# Patient Record
Sex: Male | Born: 1985 | Race: White | Hispanic: No | Marital: Married | Smoking: Current some day smoker
Health system: Southern US, Community
[De-identification: ages and names within clinical notes are randomized; demographics above are authoritative.]

## PROBLEM LIST (undated history)

## (undated) DIAGNOSIS — F419 Anxiety disorder, unspecified: Secondary | ICD-10-CM

## (undated) DIAGNOSIS — J45909 Unspecified asthma, uncomplicated: Secondary | ICD-10-CM

## (undated) HISTORY — PX: THUMB ARTHROSCOPY: SHX2509

---

## 2012-05-17 ENCOUNTER — Emergency Department (HOSPITAL_COMMUNITY)
Admission: EM | Admit: 2012-05-17 | Discharge: 2012-05-17 | Disposition: A | Discharge status: Home / Self Care | Payer: Self-pay | Attending: Emergency Medicine | Admitting: Emergency Medicine

## 2012-05-17 ENCOUNTER — Encounter (HOSPITAL_COMMUNITY): Payer: Self-pay | Admitting: Emergency Medicine

## 2012-05-17 DIAGNOSIS — L0100 Impetigo, unspecified: Secondary | ICD-10-CM | POA: Insufficient documentation

## 2012-05-17 HISTORY — DX: Anxiety disorder, unspecified: F41.9

## 2012-05-17 MED ORDER — CLINDAMYCIN HCL 300 MG PO CAPS: 300.0000 mg | ORAL_CAPSULE | Freq: Four times a day (QID) | ORAL | Status: AC
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
## 2012-05-17 NOTE — Discharge Instructions (Signed)
You were seen and evaluated for your scalp condition. At this time your providers feel your symptoms and rash may be caused from a bacterial infection. You have been given antibiotics to take to help with your symptoms. Please take these for the full length of time. If you develop any worsening symptoms, increasing swelling, pain, hair loss please return to the emergency room.     Impetigo Impetigo is an infection of the skin, most common in babies and children.  CAUSES  It is caused by staphylococcal or streptococcal germs (bacteria). Impetigo can start after any damage to the skin. The damage to the skin may be from things like:   Chickenpox.   Scrapes.   Scratches.   Insect bites (common when children scratch the bite).   Cuts.   Nail biting or chewing.  Impetigo is contagious. It can be spread from one person to another. Avoid close skin contact, or sharing towels or clothing. SYMPTOMS  Impetigo usually starts out as small blisters or pustules. Then they turn into tiny yellow-crusted sores (lesions).  There may also be:  Large blisters.   Itching or pain.   Pus.   Swollen lymph glands.  With scratching, irritation, or non-treatment, these small areas may get larger. Scratching can cause the germs to get under the fingernails; then scratching another part of the skin can cause the infection to be spread there. DIAGNOSIS  Diagnosis of impetigo is usually made by a physical exam. A skin culture (test to grow bacteria) may be done to prove the diagnosis or to help decide the best treatment.  TREATMENT  Mild impetigo can be treated with prescription antibiotic cream. Oral antibiotic medicine may be used in more severe cases. Medicines for itching may be used. HOME CARE INSTRUCTIONS   To avoid spreading impetigo to other body areas:   Keep fingernails short and clean.   Avoid scratching.   Cover infected areas if necessary to keep from scratching.   Gently wash the  infected areas with antibiotic soap and water.   Soak crusted areas in warm soapy water using antibiotic soap.   Gently rub the areas to remove crusts. Do not scrub.   Wash hands often to avoid spread this infection.   Keep children with impetigo home from school or daycare until they have used an antibiotic cream for 48 hours (2 days) or oral antibiotic medicine for 24 hours (1 day), and their skin shows significant improvement.   Children may attend school or daycare if they only have a few sores and if the sores can be covered by a bandage or clothing.  SEEK MEDICAL CARE IF:   More blisters or sores show up despite treatment.   Other family members get sores.   Rash is not improving after 48 hours (2 days) of treatment.  SEEK IMMEDIATE MEDICAL CARE IF:   You see spreading redness or swelling of the skin around the sores.   You see red streaks coming from the sores.   Your child develops a fever of 100.4 F (37.2 C) or higher.   Your child develops a sore throat.   Your child is acting ill (lethargic, sick to their stomach).  Document Released: 12/06/2000 Document Revised: 11/28/2011 Document Reviewed: 10/05/2008 [facility] Patient Information 2012 [place], [place].    RESOURCE GUIDE  Dental Problems  Patients with Medicaid: [facility]  [address]                                           [phone] [address] Phone:  [phone]                                                  Phone:  [phone]  If unable to pay or uninsured, contact:  [facility] or [facility]. to become qualified for the adult dental clinic.  Chronic Pain Problems Contact [facility]  [phone] Patients need to be referred by their primary care doctor.  Insufficient Money for Medicine Contact [employer]:  call "211" or [facility] (704) 235-7386.  No Primary Care Doctor Call [facility]   [phone] Other agencies that provide inexpensive medical care    [facility]  [phone]    [facility]  [phone]    [facility]  [phone]    [facility]  [phone]    [facility]  [phone]    [facility]  [phone]  [facility]  [phone] [facility] Services  [phone] [facility]   [phone] (emergency services [phone])  Substance Abuse Resources [facility]  [phone] [facility] [phone] [facility] [phone] [facility] [phone] Residential & Outpatient Substance Abuse Program  [phone]  Abuse/Neglect [facility] Child Abuse Hotline 986-229-1265 [facility] Child Abuse Hotline [phone] (After Hours)  Emergency Shelter [facility] [phone]  M[facility] [place] [facility] [phone] [facility] [phone]  MRSA Hotline #:   (773)849-1994    [facility] Resources  [facility] [place]     [employer]                          [facility] Dept. [address] [place]                       [address]      [address]  Blondell Reveal Phone:  [phone]                                   Phone:  [phone]                 Phone:  [phone]  [facility] Phone:  [phone]  [facility] Child Abuse Hotline (704) 103-1806 [phone] (After Hours)

## 2012-05-17 NOTE — ED Notes (Signed)
Patient is AOx4 and comfortable with her discharge instructions. 

## 2012-05-18 NOTE — ED Provider Notes (Signed)
Patient evaluated by myself, has a small area to the back right side of his head which is erythematous mildly tender and raised. On physical exam there is no sign of hair loss, no broken hair shafts, mild tenderness across about this 1.5 cm lesion.  I suspect this is early impetigo, antibiotics indicated patient informed of results and will followup as suggested.  Medical screening examination/treatment/procedure(s) were conducted as a shared visit with non-physician practitioner(s) and myself.  I personally evaluated the patient during the encounter     Vida Roller, MD 05/18/12 [phone]

## 2017-03-28 ENCOUNTER — Emergency Department (HOSPITAL_COMMUNITY)
Admission: EM | Admit: 2017-03-28 | Discharge: 2017-03-28 | Disposition: A | Discharge status: Home / Self Care | Payer: Medicaid Other | Attending: Emergency Medicine | Admitting: Emergency Medicine

## 2017-03-28 ENCOUNTER — Emergency Department (HOSPITAL_COMMUNITY): Payer: Medicaid Other

## 2017-03-28 ENCOUNTER — Encounter (HOSPITAL_COMMUNITY): Payer: Self-pay

## 2017-03-28 DIAGNOSIS — Z23 Encounter for immunization: Secondary | ICD-10-CM | POA: Diagnosis not present

## 2017-03-28 DIAGNOSIS — Y929 Unspecified place or not applicable: Secondary | ICD-10-CM | POA: Diagnosis not present

## 2017-03-28 DIAGNOSIS — S060X1A Concussion with loss of consciousness of 30 minutes or less, initial encounter: Secondary | ICD-10-CM | POA: Insufficient documentation

## 2017-03-28 DIAGNOSIS — M542 Cervicalgia: Secondary | ICD-10-CM | POA: Diagnosis not present

## 2017-03-28 DIAGNOSIS — S0990XA Unspecified injury of head, initial encounter: Secondary | ICD-10-CM | POA: Diagnosis present

## 2017-03-28 DIAGNOSIS — J45909 Unspecified asthma, uncomplicated: Secondary | ICD-10-CM | POA: Insufficient documentation

## 2017-03-28 DIAGNOSIS — F17200 Nicotine dependence, unspecified, uncomplicated: Secondary | ICD-10-CM | POA: Diagnosis not present

## 2017-03-28 DIAGNOSIS — Y939 Activity, unspecified: Secondary | ICD-10-CM | POA: Insufficient documentation

## 2017-03-28 DIAGNOSIS — Y999 Unspecified external cause status: Secondary | ICD-10-CM | POA: Diagnosis not present

## 2017-03-28 DIAGNOSIS — S0191XA Laceration without foreign body of unspecified part of head, initial encounter: Secondary | ICD-10-CM

## 2017-03-28 DIAGNOSIS — S0101XA Laceration without foreign body of scalp, initial encounter: Secondary | ICD-10-CM | POA: Insufficient documentation

## 2017-03-28 HISTORY — DX: Unspecified asthma, uncomplicated: J45.909

## 2017-03-28 LAB — CBC WITH DIFFERENTIAL/PLATELET
BASOS ABS: 0 10*3/uL (ref 0.0–0.1)
Basophils Relative: 0 %
EOS ABS: 0.1 10*3/uL (ref 0.0–0.7)
EOS PCT: 1 %
HCT: 43.2 % (ref 39.0–52.0)
HEMOGLOBIN: 14.8 g/dL (ref 13.0–17.0)
LYMPHS PCT: 12 %
Lymphs Abs: 1.5 10*3/uL (ref 0.7–4.0)
MCH: 31.1 pg (ref 26.0–34.0)
MCHC: 34.3 g/dL (ref 30.0–36.0)
MCV: 90.8 fL (ref 78.0–100.0)
Monocytes Absolute: 1.2 10*3/uL — ABNORMAL HIGH (ref 0.1–1.0)
Monocytes Relative: 10 %
Neutro Abs: 9.7 10*3/uL — ABNORMAL HIGH (ref 1.7–7.7)
Neutrophils Relative %: 77 %
PLATELETS: 187 10*3/uL (ref 150–400)
RBC: 4.76 MIL/uL (ref 4.22–5.81)
RDW: 12.3 % (ref 11.5–15.5)
WBC: 12.5 10*3/uL — AB (ref 4.0–10.5)

## 2017-03-28 LAB — BASIC METABOLIC PANEL
Anion gap: 11 (ref 5–15)
BUN: 7 mg/dL (ref 6–20)
CO2: 24 mmol/L (ref 22–32)
Calcium: 9.2 mg/dL (ref 8.9–10.3)
Chloride: 103 mmol/L (ref 101–111)
Creatinine, Ser: 0.98 mg/dL (ref 0.61–1.24)
GFR calc Af Amer: >60 mL/min (ref >60)
Glucose, Bld: 100 mg/dL — ABNORMAL HIGH (ref 65–99)
POTASSIUM: 3.3 mmol/L — AB (ref 3.5–5.1)
SODIUM: 138 mmol/L (ref 135–145)

## 2017-03-28 LAB — ABO/RH: ABO/RH(D): A POS

## 2017-03-28 LAB — TYPE AND SCREEN
ABO/RH(D): A POS
Antibody Screen: NEGATIVE

## 2017-03-28 MED ORDER — HYDROCODONE-ACETAMINOPHEN 5-325 MG PO TABS: 1.0000 | ORAL_TABLET | ORAL | 0 refills | Status: AC | PRN

## 2017-03-28 MED ORDER — HYDROCODONE-ACETAMINOPHEN 5-325 MG PO TABS
1.0000 | ORAL_TABLET | Freq: Once | ORAL | Status: AC
  Administered 2017-03-28: 1 via ORAL
  Filled 2017-03-28: qty 1

## 2017-03-28 MED ORDER — TETANUS-DIPHTH-ACELL PERTUSSIS 5-2.5-18.5 LF-MCG/0.5 IM SUSP
INTRAMUSCULAR | Status: AC
  Filled 2017-03-28: qty 0.5

## 2017-03-28 MED ORDER — TETANUS-DIPHTHERIA TOXOIDS TD 5-2 LFU IM INJ
0.5000 mL | INJECTION | Freq: Once | INTRAMUSCULAR | Status: DC
  Filled 2017-03-28: qty 0.5

## 2017-03-28 MED ORDER — TETANUS-DIPHTH-ACELL PERTUSSIS 5-2.5-18.5 LF-MCG/0.5 IM SUSP
0.5000 mL | Freq: Once | INTRAMUSCULAR | Status: AC
Start: 2017-03-28 — End: 2017-03-28
  Administered 2017-03-28: 0.5 mL via INTRAMUSCULAR
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
## 2017-03-28 NOTE — ED Notes (Signed)
Pt brought back from CT at this time without incident.

## 2017-03-28 NOTE — ED Notes (Signed)
3 staples placed to middle occipital region of head by resident Dr. Lonia Mad
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
## 2017-03-28 NOTE — ED Notes (Signed)
Pt taken to CT at this time.

## 2017-03-28 NOTE — ED Notes (Signed)
EKG given to Dr. Cardama  
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
## 2017-03-31 ENCOUNTER — Emergency Department (HOSPITAL_COMMUNITY)
Admission: EM | Admit: 2017-03-31 | Discharge: 2017-03-31 | Disposition: A | Discharge status: Home / Self Care | Payer: Medicaid Other | Attending: Emergency Medicine | Admitting: Emergency Medicine

## 2017-03-31 ENCOUNTER — Emergency Department (HOSPITAL_COMMUNITY): Payer: Medicaid Other

## 2017-03-31 ENCOUNTER — Encounter (HOSPITAL_COMMUNITY): Payer: Self-pay | Admitting: Emergency Medicine

## 2017-03-31 ENCOUNTER — Encounter (HOSPITAL_COMMUNITY): Payer: Self-pay | Admitting: *Deleted

## 2017-03-31 DIAGNOSIS — R42 Dizziness and giddiness: Secondary | ICD-10-CM | POA: Insufficient documentation

## 2017-03-31 DIAGNOSIS — Z79899 Other long term (current) drug therapy: Secondary | ICD-10-CM | POA: Diagnosis not present

## 2017-03-31 DIAGNOSIS — F0781 Postconcussional syndrome: Secondary | ICD-10-CM | POA: Diagnosis not present

## 2017-03-31 DIAGNOSIS — J45909 Unspecified asthma, uncomplicated: Secondary | ICD-10-CM | POA: Diagnosis not present

## 2017-03-31 DIAGNOSIS — F17200 Nicotine dependence, unspecified, uncomplicated: Secondary | ICD-10-CM | POA: Insufficient documentation

## 2017-03-31 LAB — CBC
HCT: 47.4 % (ref 39.0–52.0)
Hemoglobin: 16.6 g/dL (ref 13.0–17.0)
MCH: 31.6 pg (ref 26.0–34.0)
MCHC: 35 g/dL (ref 30.0–36.0)
MCV: 90.1 fL (ref 78.0–100.0)
PLATELETS: 211 10*3/uL (ref 150–400)
RBC: 5.26 MIL/uL (ref 4.22–5.81)
RDW: 12.2 % (ref 11.5–15.5)
WBC: 7.4 10*3/uL (ref 4.0–10.5)

## 2017-03-31 LAB — BASIC METABOLIC PANEL
Anion gap: 12 (ref 5–15)
BUN: 6 mg/dL (ref 6–20)
CALCIUM: 9.7 mg/dL (ref 8.9–10.3)
CO2: 26 mmol/L (ref 22–32)
CREATININE: 0.85 mg/dL (ref 0.61–1.24)
Chloride: 101 mmol/L (ref 101–111)
GFR calc Af Amer: >60 mL/min (ref >60)
GFR calc non Af Amer: >60 mL/min (ref >60)
Glucose, Bld: 100 mg/dL — ABNORMAL HIGH (ref 65–99)
POTASSIUM: 3.9 mmol/L (ref 3.5–5.1)
SODIUM: 139 mmol/L (ref 135–145)

## 2017-03-31 MED ORDER — DIAZEPAM 2 MG PO TABS
2.0000 mg | ORAL_TABLET | Freq: Once | ORAL | Status: AC
  Administered 2017-03-31: 2 mg via ORAL
  Filled 2017-03-31: qty 1

## 2017-03-31 MED ORDER — ONDANSETRON HCL 4 MG/2ML IJ SOLN
4.0000 mg | Freq: Once | INTRAMUSCULAR | Status: AC
  Administered 2017-03-31: 4 mg via INTRAVENOUS
  Filled 2017-03-31: qty 2

## 2017-03-31 MED ORDER — MECLIZINE HCL 25 MG PO TABS: 25.0000 mg | ORAL_TABLET | Freq: Three times a day (TID) | ORAL | 0 refills | Status: AC | PRN

## 2017-03-31 MED ORDER — MECLIZINE HCL 25 MG PO TABS
25.0000 mg | ORAL_TABLET | Freq: Once | ORAL | Status: AC
  Administered 2017-03-31: 25 mg via ORAL
  Filled 2017-03-31: qty 1

## 2017-03-31 MED ORDER — DIAZEPAM 5 MG PO TABS: 2.5000 mg | ORAL_TABLET | Freq: Two times a day (BID) | ORAL | 0 refills | Status: AC | PRN

## 2017-03-31 MED ORDER — SODIUM CHLORIDE 0.9 % IV BOLUS (SEPSIS)
1000.0000 mL | Freq: Once | INTRAVENOUS | Status: AC
  Administered 2017-03-31: 1000 mL via INTRAVENOUS
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
## 2017-03-31 NOTE — ED Notes (Signed)
Pt was able to walk with a walker, but unsteady. Pt states he feels like he is walking in a pool.

## 2017-03-31 NOTE — Discharge Instructions (Signed)
Please do not drive or operate heavy machinery until your symptoms are resolved. Rest and drink plenty of water. Follow these instructions at home: Take medicines only as directed by your health care provider. Do not take aspirin. Aspirin can slow blood clotting. Sleep with your head slightly elevated to help with headaches. Avoid any situation where there is potential for another head injury. This includes football, hockey, soccer, basketball, martial arts, downhill snow sports, and horseback riding. Your condition will get worse every time you experience a concussion. You should avoid these activities until you are evaluated by the appropriate follow-up health care providers. Keep all follow-up visits as directed by your health care provider. This is important. Contact a health care provider if: You have increased problems paying attention or concentrating. You have increased difficulty remembering or learning new information. You need more time to complete tasks or assignments than before. You have increased irritability or decreased ability to cope with stress. You have more symptoms than before. Seek medical care if you have any of the following symptoms for more than two weeks after your injury: Lasting (chronic) headaches. Dizziness or balance problems. Nausea. Vision problems. Increased sensitivity to noise or light. Depression or mood swings. Anxiety or irritability. Memory problems. Difficulty concentrating or paying attention. Sleep problems. Feeling tired all the time. Get help right away if: You have confusion or unusual drowsiness. Others find it difficult to wake you up. You have nausea or persistent, forceful vomiting. You feel like you are moving when you are not (vertigo). Your eyes may move rapidly back and forth. You have convulsions or faint. You have severe, persistent headaches that are not relieved by medicine. You cannot use your arms or legs normally. One of  your pupils is larger than the other. You have clear or bloody discharge from your nose or ears. Your problems are getting worse, not better.

## 2017-03-31 NOTE — ED Notes (Signed)
Patient transported to X-ray 
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
